# Patient Record
Sex: Female | Born: 2000 | Marital: Single | Smoking: Never smoker
Health system: Southern US, Community
[De-identification: ages and names within clinical notes are randomized; demographics above are authoritative.]

## PROBLEM LIST (undated history)

## (undated) DIAGNOSIS — E559 Vitamin D deficiency, unspecified: Secondary | ICD-10-CM

## (undated) HISTORY — DX: Vitamin D deficiency, unspecified: E55.9

---
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
## 2022-05-17 IMAGING — US US BREAST*R* LIMITED INC AXILLA
1 series · 1 of 1 positions shown · non-contrast
Comparison: Previous exam(s).

CLINICAL DATA: Palpable lump superior to the right breast.

EXAM:
ULTRASOUND OF THE RIGHT CHEST WALL

[Series 1: us breast*right* limited inc axilla · 0.07mm/px · 1 of 1 slices shown]
[im 1/1]
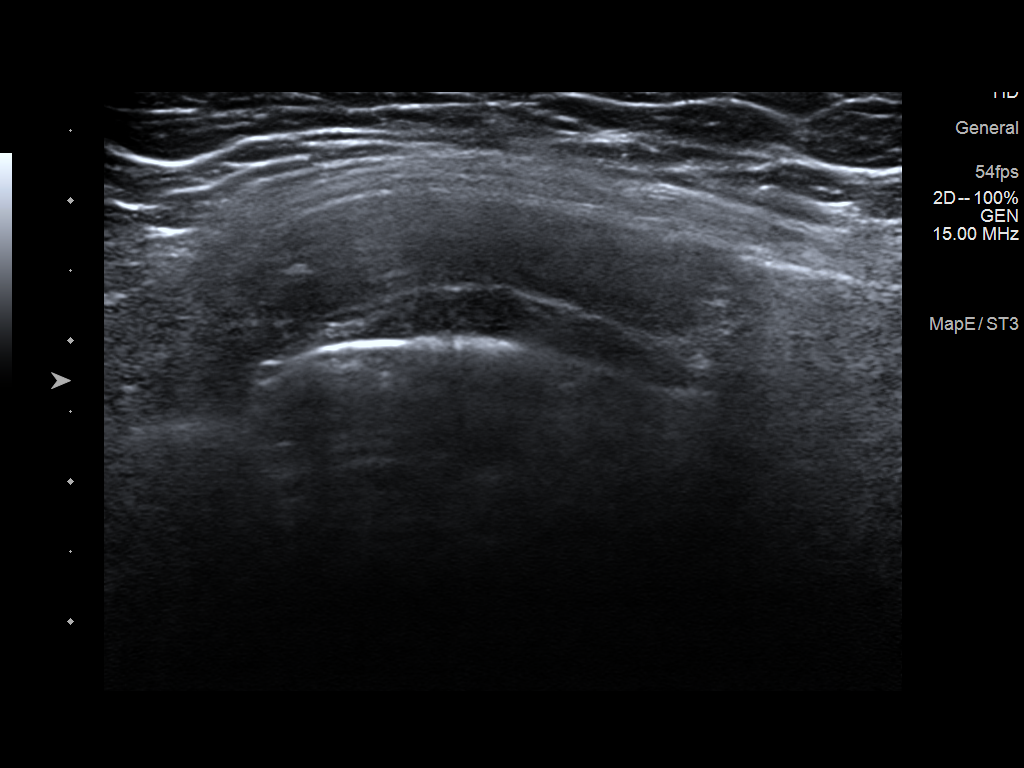

[1 of 1 positions shown; findings below may reference images not displayed]

FINDINGS: On physical exam, there is mild prominence of the right first
costochondral cartilage relative to the left.

Targeted ultrasound is performed, showing the right costochondral
cartilage in the region of the patient's lump. The anterior border
of the first costochondral cartilage is more convex versus the left.
IMPRESSION: The patient appears to be palpating mild asymmetry in the
costochondral cartilage on the right relative to the left. No
suspicious masses identified.

RECOMMENDATION:
Treatment of the patient's symptoms should be based on clinical and
physical exam given lack of abnormal findings. I believe the
patient's findings are likely due to mild asymmetry in the
costochondral cartilages as above. If there is continued concern,
MRI or CT could better evaluate.

I have discussed the findings and recommendations with the patient.
If applicable, a reminder letter will be sent to the patient
regarding the next appointment.

BI-RADS CATEGORY  2: Benign.

## 2022-06-20 ENCOUNTER — Ambulatory Visit (INDEPENDENT_AMBULATORY_CARE_PROVIDER_SITE_OTHER): Payer: Medicaid Other | Admitting: Pediatric Endocrinology

## 2022-06-20 DIAGNOSIS — E8881 Metabolic syndrome: Secondary | ICD-10-CM

## 2022-07-05 HISTORY — PX: WISDOM TOOTH EXTRACTION: SHX21

## 2022-08-30 ENCOUNTER — Ambulatory Visit (INDEPENDENT_AMBULATORY_CARE_PROVIDER_SITE_OTHER): Payer: Medicaid Other | Admitting: Pediatric Endocrinology

## 2022-08-30 ENCOUNTER — Encounter (INDEPENDENT_AMBULATORY_CARE_PROVIDER_SITE_OTHER): Payer: Self-pay | Admitting: Pediatric Endocrinology

## 2022-08-30 VITALS — BP 108/70 | Ht 62.28 in | Wt 185.6 lb

## 2022-08-30 DIAGNOSIS — L680 Hirsutism: Secondary | ICD-10-CM | POA: Diagnosis not present

## 2022-08-30 DIAGNOSIS — N915 Oligomenorrhea, unspecified: Secondary | ICD-10-CM

## 2022-08-30 DIAGNOSIS — E88819 Insulin resistance, unspecified: Secondary | ICD-10-CM

## 2022-08-30 DIAGNOSIS — L83 Acanthosis nigricans: Secondary | ICD-10-CM

## 2022-08-30 LAB — POCT GLYCOSYLATED HEMOGLOBIN (HGB A1C): Hemoglobin A1C: 5.2 % (ref 4.0–5.6)

## 2022-08-30 LAB — POCT GLUCOSE (DEVICE FOR HOME USE): POC Glucose: 91 mg/dl (ref 70–99)

## 2022-08-30 MED ORDER — METFORMIN HCL ER 750 MG PO TB24: 750.0000 mg | ORAL_TABLET | Freq: Every day | ORAL | 1 refills | Status: DC

## 2022-08-30 NOTE — Progress Notes (Signed)
Subjective:  Subjective  Patient Name: Samantha White Date of Birth: 2001/09/09  MRN: [id number]  Samantha White  presents to the office today for follow up evaluation and management of her oligomenorrhea.   HISTORY OF PRESENT ILLNESS:   Samantha White is a 21 y.o. [place] female referred for  Irregular menses with oligomenorrhea.   Samantha White was accompanied by her mother and sister.   1. Samantha White was seen by her PCP in May 2020 for eye irritation/swelling. At that visit they discussed previous issues with her hormone levels, hair growth, and irregular menses. Dr. Maisie Fus felt that it could all be connected and referred her to Endocrinology. She had previously been seen the adolescent medicine clinic. They recommended OCP but she did not start it.   2. Samantha White was last seen in pediatric endocrine clinic on 12/21/21. In the interim she has been generally healthy.   She feels that everything has been pretty stable.   She did not have a period from May-Aug. She did have a period on 9/8. She did not get a period in October.   She stopped going to [facility] right before Ramadan (April).   She is doing some work outs at home. 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 She says that the exercises she was doing at [facility] were more intense and also more towards muscle building- which is not her goal. She says that her goal is to lose weight. She would like to tone a little.   She is taking Metformin "on and off". She was more "off" during the summer due to irregular schedules and eating more often away from home.   She is meant to be taking Metformin 500 mg BID. However, when she tried that it "made me SO sick I couldn't even sleep". She describes it as severe stomach pain.   She is drinking mostly water.      3. [name]:  Constitutional: The patient feels "feeling good". The patient seems healthy and active. Eyes: Vision seems to be good. There are no recognized eye  problems. Neck: The patient has no complaints of anterior neck swelling, soreness, tenderness, pressure, discomfort, or difficulty swallowing.   Heart: Heart rate increases with exercise or other physical activity. The patient has no complaints of palpitations, irregular heart beats, chest pain, or chest pressure.   Gastrointestinal: Bowel movents seem normal. The patient has no complaints of excessive hunger, diarrhea, or constipation.  Legs: Muscle mass and strength seem normal. There are no complaints of numbness, tingling, burning, or pain. No edema is noted.  Feet: There are no obvious foot problems. There are no complaints of numbness, tingling, burning, or pain. No edema is noted. Neurologic: There are no recognized problems with muscle movement and strength, sensation, or coordination. GYN/GU: per HPI LMP 07/08/22  PAST MEDICAL, FAMILY, AND SOCIAL HISTORY   History reviewed. No pertinent past medical history.  Family History  Problem Relation Age of Onset   Diabetes Maternal Grandmother    Diabetes Maternal Grandfather    Hypothyroidism Paternal Grandmother      Current Outpatient Medications:    metFORMIN (GLUCOPHAGE-XR) 750 MG 24 hr tablet, Take 1 tablet (750 mg total) by mouth daily with breakfast., Disp: 90 tablet, Rfl: 1   Cholecalciferol (VITAMIN D) 50 MCG (2000 UT) CAPS, Take by mouth. (Patient not taking: Reported on 08/30/2022), Disp: , Rfl:    Ibuprofen (MOTRIN PO), Take by mouth. (Patient not taking: Reported on 06/30/2021), Disp: ,  Rfl:    naproxen (NAPROSYN) 500 MG tablet, TAKE 1 TABLET(500 MG) BY MOUTH TWICE DAILY WITH A MEAL (Patient not taking: Reported on 08/30/2022), Disp: 60 tablet, Rfl: 3  Allergies as of 08/30/2022   (No Known Allergies)     reports that she has never smoked. She has never used smokeless tobacco. Pediatric History  Patient Parents   Samantha White, Samantha White (Mother)   Other Topics Concern   Not on file  Social History Narrative   She lives with  her parents and siblings.       Attends [employer] for dental hygiene. 23-24 school year    1. School and Family: Lives with parents and siblings. [employer]. Dental Hygienist program 2. Activities: [employer]. Wants to do dental hygenics. Anatomy and Physiology 3. Primary Care Provider: Barnet Pall, MD  ROS: There are no other significant problems involving Samantha White's other body systems.    Objective:  Objective   Vital Signs:    BP 108/70   Ht 5' 2.28" (1.582 m)   Wt 185 lb 9.6 oz (84.2 kg)   LMP 07/08/2022 (Exact Date)   BMI 33.64 kg/m    Blood pressure %iles are not available for patients who are 18 years or older.  Ht Readings from Last 3 Encounters:  08/30/22 5' 2.28" (1.582 m) (22 %, Z= -0.79)*  12/21/21 5' 2.36" (1.584 m) (23 %, Z= -0.75)*  07/13/21 5\' 2"  (1.575 m) (19 %, Z= -0.88)*   * Growth percentiles are based on CDC (Girls, 2-20 Years) data.   Wt Readings from Last 3 Encounters:  08/30/22 185 lb 9.6 oz (84.2 kg) (96 %, Z= 1.71)*  12/21/21 183 lb (83 kg) (95 %, Z= 1.69)*  07/13/21 180 lb (81.6 kg) (95 %, Z= 1.66)*   * Growth percentiles are based on CDC (Girls, 2-20 Years) data.   HC Readings from Last 3 Encounters:  No data found for [facility]   Body surface area is 1.92 meters squared. 22 %ile (Z= -0.79) based on CDC (Girls, 2-20 Years) Stature-for-age data based on Stature recorded on 08/30/2022. 96 %ile (Z= 1.71) based on CDC (Girls, 2-20 Years) weight-for-age data using vitals from 08/30/2022.  PHYSICAL EXAM:    Constitutional: The patient appears healthy and well nourished. The patient's height and weight are normal for age. Weight is +2 since last visit Head: The head is normocephalic. Face: The face appears normal. There are no obvious dysmorphic features. Eyes: The eyes appear to be normally formed and spaced. Gaze is conjugate. There is no obvious arcus or proptosis. Moisture appears normal. Ears: The ears are normally placed and appear externally  normal. Mouth: The oropharynx and tongue appear normal. Dentition appears to be normal for age. Oral moisture is normal. Neck: The neck appears to be visibly normal.  The thyroid gland is 18-20 grams in size. The consistency of the thyroid gland is normal. The thyroid gland is not tender to palpation. Lungs: No increased work of breathing or cough. CTA Heart: Heart rate , pulses, and peripheral perfusion are normal. S1S2 RRR Abdomen: The abdomen appears to be enlarged in size for the patient's age. Bowel sounds are normal. There is no obvious hepatomegaly, splenomegaly, or other mass effect.  Arms: Muscle size and bulk are normal for age. Hands: There is no obvious tremor. Phalangeal and metacarpophalangeal joints are normal. Palmar muscles are normal for age. Palmar skin is normal. Palmar moisture is also normal. Mild acanthosis in axillae- improving Legs: Muscles appear normal for age. No  edema is present. Feet: Feet are normally formed. Dorsalis pedal pulses are normal. Neurologic: Strength is normal for age in both the upper and lower extremities. Muscle tone is normal. Sensation to touch is normal in both the legs and feet.    LAB DATA:    Lab Results  Component Value Date   HGBA1C 5.2 08/30/2022   HGBA1C 5.0 12/21/2021   HGBA1C 5.1 06/30/2021   HGBA1C 5.2 12/29/2020     Results for orders placed or performed in visit on 08/30/22  POCT Glucose (Device for Home Use)  Result Value Ref Range   Glucose Fasting, POC     POC Glucose 91 70 - 99 mg/dl  POCT glycosylated hemoglobin (Hb A1C)  Result Value Ref Range   Hemoglobin A1C 5.2 4.0 - 5.6 %   HbA1c POC (<> result, manual entry)     HbA1c, POC (prediabetic range)     HbA1c, POC (controlled diabetic range)       Office Visit on 12/21/2021  Component Date Value Ref Range Status   Hemoglobin A1C 12/21/2021 5.0  4.0 - 5.6 % Final   POC Glucose 12/21/2021 95  70 - 99 mg/dl Final     Results for orders placed or performed in  visit on 08/30/22 (from the past 672 hour(s))  POCT Glucose (Device for Home Use)   Collection Time: 08/30/22  1:46 PM  Result Value Ref Range   Glucose Fasting, POC     POC Glucose 91 70 - 99 mg/dl  POCT glycosylated hemoglobin (Hb A1C)   Collection Time: 08/30/22  1:54 PM  Result Value Ref Range   Hemoglobin A1C 5.2 4.0 - 5.6 %   HbA1c POC (<> result, manual entry)     HbA1c, POC (prediabetic range)     HbA1c, POC (controlled diabetic range)        Assessment and Plan:  Assessment  ASSESSMENT: Liliya is a 21 y.o. [place] woman referred for oligomenorrhea and hirsutism with concerns for PCOS    She was previously evaluated for PCOS in Adolescent clinic in October 2019 and did not follow through with their recommendation to start OCP.   Oligomenorrhea/Hirsutism - Continues on Metformin - Had more regular menses with more intensive exercise ([employer]) - Over Ramadan she stopped her intense exercise  - She did not tolerate the increase in Metformin dose to 1000 mg per day and was less consistent with taking it over the summer - She did not have menses in June/July/Aug. She did have a period in September.  - Discussed options including   Re intensifying exercise  Increasing Metformin  Starting GLP-1 - She would like to focus on exercise and increase the Metformin to 750.     PLAN:    1. Diagnostic: Lab Orders         POCT Glucose (Device for Home Use)         POCT glycosylated hemoglobin (Hb A1C)      2. Therapeutic: Increase metformin to 750 mg daily. Discussed option to add a GLP-1 but she declines for now.  3. Patient education: Discussion as above.  4. Follow-up: Return in about 4 months (around 12/29/2022).      Dessa Phi, MD   LOS Level of Service: >40 minutes spent today reviewing the medical chart, counseling the patient/family, and documenting today's encounter.      Patient referred by Barnet Pall, MD for  Oligomenorrhea, hirsutism    Copy of this note sent to [facility],  Vania Rea, MD

## 2022-09-07 ENCOUNTER — Other Ambulatory Visit (INDEPENDENT_AMBULATORY_CARE_PROVIDER_SITE_OTHER): Payer: Self-pay | Admitting: Pediatric Endocrinology

## 2022-09-07 DIAGNOSIS — N946 Dysmenorrhea, unspecified: Secondary | ICD-10-CM

## 2022-09-07 MED ORDER — NAPROXEN 500 MG PO TABS: ORAL_TABLET | ORAL | 3 refills | Status: AC

## 2022-09-07 NOTE — Telephone Encounter (Signed)
  Name of who is calling: [place]  Best contact number: [phone]  Provider they see: Dr. Vanessa [place]  Reason for call: [place] is calling to get her naproxen refilled.      PRESCRIPTION REFILL ONLY  Name of prescription: Naproxen  Pharmacy: [facility]

## 2023-01-03 ENCOUNTER — Ambulatory Visit (INDEPENDENT_AMBULATORY_CARE_PROVIDER_SITE_OTHER): Payer: Medicaid Other | Admitting: Pediatric Endocrinology

## 2023-03-08 ENCOUNTER — Ambulatory Visit (INDEPENDENT_AMBULATORY_CARE_PROVIDER_SITE_OTHER): Payer: Medicaid Other | Admitting: Pediatric Endocrinology

## 2023-03-23 ENCOUNTER — Ambulatory Visit (INDEPENDENT_AMBULATORY_CARE_PROVIDER_SITE_OTHER): Payer: Medicaid Other | Admitting: Nurse Practitioner

## 2023-03-23 ENCOUNTER — Encounter: Payer: Self-pay | Admitting: Nurse Practitioner

## 2023-03-23 VITALS — BP 102/76 | HR 77 | Temp 97.3°F | Ht 63.0 in | Wt 185.0 lb

## 2023-03-23 DIAGNOSIS — E559 Vitamin D deficiency, unspecified: Secondary | ICD-10-CM | POA: Diagnosis not present

## 2023-03-23 DIAGNOSIS — Z0000 Encounter for general adult medical examination without abnormal findings: Secondary | ICD-10-CM | POA: Insufficient documentation

## 2023-03-23 DIAGNOSIS — Z131 Encounter for screening for diabetes mellitus: Secondary | ICD-10-CM

## 2023-03-23 DIAGNOSIS — E669 Obesity, unspecified: Secondary | ICD-10-CM | POA: Diagnosis not present

## 2023-03-23 LAB — CBC WITH DIFFERENTIAL/PLATELET
Basophils Absolute: 0 10*3/uL (ref 0.0–0.1)
Basophils Relative: 0.4 % (ref 0.0–3.0)
Eosinophils Absolute: 0 10*3/uL (ref 0.0–0.7)
Eosinophils Relative: 0.6 % (ref 0.0–5.0)
HCT: 39.6 % (ref 36.0–46.0)
Hemoglobin: 12.6 g/dL (ref 12.0–15.0)
Lymphocytes Relative: 34.9 % (ref 12.0–46.0)
Lymphs Abs: 2.6 10*3/uL (ref 0.7–4.0)
MCHC: 31.9 g/dL (ref 30.0–36.0)
MCV: 67.1 fl — ABNORMAL LOW (ref 78.0–100.0)
Monocytes Absolute: 0.5 10*3/uL (ref 0.1–1.0)
Monocytes Relative: 6.7 % (ref 3.0–12.0)
Neutro Abs: 4.2 10*3/uL (ref 1.4–7.7)
Neutrophils Relative %: 57.4 % (ref 43.0–77.0)
Platelets: 290 10*3/uL (ref 150.0–400.0)
RBC: 5.91 Mil/uL — ABNORMAL HIGH (ref 3.87–5.11)
RDW: 15 % — ABNORMAL HIGH (ref 11.5–14.6)
WBC: 7.4 10*3/uL (ref 4.5–10.5)

## 2023-03-23 LAB — COMPREHENSIVE METABOLIC PANEL
ALT: 13 U/L (ref 0–35)
AST: 16 U/L (ref 0–37)
Albumin: 4.4 g/dL (ref 3.5–5.2)
Alkaline Phosphatase: 51 U/L (ref 39–117)
BUN: 10 mg/dL (ref 6–23)
CO2: 30 mEq/L (ref 19–32)
Calcium: 9.8 mg/dL (ref 8.4–10.5)
Chloride: 100 mEq/L (ref 96–112)
Creatinine, Ser: 0.67 mg/dL (ref 0.40–1.20)
GFR: 126.08 mL/min (ref >60.00)
Glucose, Bld: 79 mg/dL (ref 70–99)
Potassium: 3.9 mEq/L (ref 3.5–5.1)
Sodium: 138 mEq/L (ref 135–145)
Total Bilirubin: 0.4 mg/dL (ref 0.2–1.2)
Total Protein: 7.2 g/dL (ref 6.0–8.3)

## 2023-03-23 LAB — VITAMIN D 25 HYDROXY (VIT D DEFICIENCY, FRACTURES): VITD: 21.5 ng/mL — ABNORMAL LOW (ref 30.00–100.00)

## 2023-03-23 LAB — HEMOGLOBIN A1C: Hgb A1c MFr Bld: 5.4 % (ref 4.6–6.5)

## 2023-03-23 NOTE — Assessment & Plan Note (Signed)
She has a history of vitamin D deficiency and is not currently taking a supplement. Will check vitamin D levels today and adjust regimen based on results.

## 2023-03-23 NOTE — Assessment & Plan Note (Signed)
BMI 32.7. Discussed nutrition, exercise.

## 2023-03-23 NOTE — Assessment & Plan Note (Signed)
Health maintenance reviewed and updated. Discussed nutrition, exercise. Check CMP, CBC today. Follow-up 1 year.   

## 2023-03-23 NOTE — Patient Instructions (Signed)
It was great to see you!  We are checking your labs today and will let you know the results via mychart/phone.   Let me know if you need anything else for school!   Let's follow-up in 1 year, sooner if you have concerns.  If a referral was placed today, you will be contacted for an appointment. Please note that routine referrals can sometimes take up to 3-4 weeks to process. Please call our office if you haven't heard anything after this time frame.  Take care,  Rodman Pickle, NP

## 2023-03-23 NOTE — Progress Notes (Signed)
New Patient Visit  BP 102/76 (BP Location: Left Arm)   Pulse 77   Temp (!) 97.3 F (36.3 C)   Ht 5\' 3"  (1.6 m)   Wt 185 lb (83.9 kg)   LMP 02/27/2023 (Exact Date)   SpO2 98%   BMI 32.77 kg/m    Subjective:    Patient ID: Samantha White, female    DOB: February 20, 2001, 22 y.o.   MRN: [id number]  CC: Chief Complaint  Patient presents with   Establish Care    NP. Est. Care, no concerns    HPI: Samantha White is a 22 y.o. female presents for new patient visit to establish care.  Introduced to Publishing rights manager role and practice setting.  All questions answered.  Discussed provider/patient relationship and expectations.  She has no concerns today. 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   Past Medical History:  Diagnosis Date   Vitamin D deficiency     Past Surgical History:  Procedure Laterality Date   WISDOM TOOTH EXTRACTION  07/05/2022   all four removed    Family History  Problem Relation Age of Onset   Migraines Mother    Polycystic ovary syndrome Mother    Healthy Father    Diabetes Maternal Grandmother    Diabetes Maternal Grandfather    Hypothyroidism Paternal Grandmother      Social History   Tobacco Use   Smoking status: Never   Smokeless tobacco: Never   Tobacco comments:    Dad smokes outside  Vaping Use   Vaping Use: Never used  Substance Use Topics   Alcohol use: Never   Drug use: Never    Current Outpatient Medications on File Prior to Visit  Medication Sig Dispense Refill   naproxen (NAPROSYN) 500 MG tablet TAKE 1 TABLET(500 MG) BY MOUTH TWICE DAILY WITH A MEAL (Patient not taking: Reported on 03/23/2023) 60 tablet 3   No current facility-administered medications on file prior to visit.     Review of Systems  Constitutional:  Positive for fatigue. Negative for fever.  HENT: Negative.    Eyes: Negative.   Respiratory: Negative.    Cardiovascular: Negative.   Gastrointestinal: Negative.    Endocrine: Negative.   Genitourinary: Negative.   Musculoskeletal: Negative.   Skin: Negative.   Neurological:  Positive for headaches (3-4 times a month).  Psychiatric/Behavioral: Negative.        Objective:    BP 102/76 (BP Location: Left Arm)   Pulse 77   Temp (!) 97.3 F (36.3 C)   Ht 5\' 3"  (1.6 m)   Wt 185 lb (83.9 kg)   LMP 02/27/2023 (Exact Date)   SpO2 98%   BMI 32.77 kg/m   Wt Readings from Last 3 Encounters:  03/23/23 185 lb (83.9 kg)  08/30/22 185 lb 9.6 oz (84.2 kg) (96 %, Z= 1.71)*  12/21/21 183 lb (83 kg) (95 %, Z= 1.69)*   * Growth percentiles are based on CDC (Girls, 2-20 Years) data.    BP Readings from Last 3 Encounters:  03/23/23 102/76  08/30/22 108/70  12/21/21 118/72    Physical Exam Vitals and nursing note reviewed.  Constitutional:      General: She is not in acute distress.    Appearance: Normal appearance.  HENT:     Head: Normocephalic and atraumatic.     Right Ear: Tympanic membrane, ear canal and external ear normal.     Left Ear: Tympanic membrane, ear canal and  external ear normal.  Eyes:     Conjunctiva/sclera: Conjunctivae normal.  Cardiovascular:     Rate and Rhythm: Normal rate and regular rhythm.     Pulses: Normal pulses.     Heart sounds: Normal heart sounds.  Pulmonary:     Effort: Pulmonary effort is normal.     Breath sounds: Normal breath sounds.  Abdominal:     Palpations: Abdomen is soft.     Tenderness: There is no abdominal tenderness.  Musculoskeletal:        General: Normal range of motion.     Cervical back: Normal range of motion and neck supple.     Right lower leg: No edema.     Left lower leg: No edema.  Lymphadenopathy:     Cervical: No cervical adenopathy.  Skin:    General: Skin is warm and dry.  Neurological:     General: No focal deficit present.     Mental Status: She is alert and oriented to person, place, and time.     Cranial Nerves: No cranial nerve deficit.     Coordination:  Coordination normal.     Gait: Gait normal.  Psychiatric:        Mood and Affect: Mood normal.        Behavior: Behavior normal.        Thought Content: Thought content normal.        Judgment: Judgment normal.       Assessment & Plan:   Problem List Items Addressed This Visit       Other   Vitamin D deficiency    She has a history of vitamin D deficiency and is not currently taking a supplement. Will check vitamin D levels today and adjust regimen based on results.       Relevant Orders   VITAMIN D 25 Hydroxy (Vit-D Deficiency, Fractures) (Completed)   Routine general medical examination at a health care facility - Primary    Health maintenance reviewed and updated. Discussed nutrition, exercise. Check CMP, CBC today. Follow-up 1 year.        Relevant Orders   CBC with Differential/Platelet (Completed)   Comprehensive metabolic panel (Completed)   Obesity (BMI 30-39.9)    BMI 32.7. Discussed nutrition, exercise.       Relevant Orders   Hemoglobin A1c (Completed)   Other Visit Diagnoses     Screening for diabetes mellitus       Check A1c today.   Relevant Orders   Hemoglobin A1c (Completed)       LABORATORY TESTING:  - Pap smear: not applicable  IMMUNIZATIONS:   - Tdap: Tetanus vaccination status reviewed: last tetanus booster within 10 years. - Influenza: Postponed to flu season - Pneumovax: Not applicable - Prevnar: Not applicable - HPV: Refused - Zostavax vaccine: Not applicable  SCREENING: -Mammogram: Not applicable  - Colonoscopy: Not applicable  - Bone Density: Not applicable  -Hearing Test: Not applicable  -Spirometry: Not applicable   PATIENT COUNSELING:   Advised to take 1 mg of folate supplement per day if capable of pregnancy.   Sexuality: Discussed sexually transmitted diseases, partner selection, use of condoms, avoidance of unintended pregnancy  and contraceptive alternatives.   Advised to avoid cigarette smoking.  I discussed with  the patient that most people either abstain from alcohol or drink within safe limits (<=14/week and <=4 drinks/occasion for males, <=7/weeks and <= 3 drinks/occasion for females) and that the risk for alcohol disorders and other health effects rises  proportionally with the number of drinks per week and how often a drinker exceeds daily limits.  Discussed cessation/primary prevention of drug use and availability of treatment for abuse.   Diet: Encouraged to adjust caloric intake to maintain  or achieve ideal body weight, to reduce intake of dietary saturated fat and total fat, to limit sodium intake by avoiding high sodium foods and not adding table salt, and to maintain adequate dietary potassium and calcium preferably from fresh fruits, vegetables, and low-fat dairy products.    stressed the importance of regular exercise  Injury prevention: Discussed safety belts, safety helmets, smoke detector, smoking near bedding or upholstery.   Dental health: Discussed importance of regular tooth brushing, flossing, and dental visits.    NEXT PREVENTATIVE PHYSICAL DUE IN 1 YEAR.   Follow up plan: Return in about 1 year (around 03/22/2024) for CPE.

## 2023-04-11 ENCOUNTER — Ambulatory Visit (INDEPENDENT_AMBULATORY_CARE_PROVIDER_SITE_OTHER): Payer: Medicaid Other | Admitting: Pediatric Endocrinology

## 2023-05-05 ENCOUNTER — Telehealth: Payer: Self-pay | Admitting: Nurse Practitioner

## 2023-05-05 ENCOUNTER — Encounter (INDEPENDENT_AMBULATORY_CARE_PROVIDER_SITE_OTHER): Payer: Self-pay

## 2023-05-05 NOTE — Telephone Encounter (Signed)
Pt is wanting her NCIR uploaded into her mychart. Pt at [phone] if any further questions.

## 2023-05-08 NOTE — Telephone Encounter (Signed)
I called and spoke with patient and I told her that I could mail to her. She wanted it mailed to [address] address

## 2023-05-09 ENCOUNTER — Encounter (INDEPENDENT_AMBULATORY_CARE_PROVIDER_SITE_OTHER): Payer: Self-pay | Admitting: Pediatric Endocrinology

## 2023-05-09 ENCOUNTER — Ambulatory Visit (INDEPENDENT_AMBULATORY_CARE_PROVIDER_SITE_OTHER): Payer: Medicaid Other | Admitting: Pediatric Endocrinology

## 2023-05-09 VITALS — BP 116/70 | HR 96 | Wt 186.6 lb

## 2023-05-09 DIAGNOSIS — L680 Hirsutism: Secondary | ICD-10-CM | POA: Diagnosis not present

## 2023-05-09 DIAGNOSIS — N915 Oligomenorrhea, unspecified: Secondary | ICD-10-CM | POA: Diagnosis not present

## 2023-05-09 DIAGNOSIS — N914 Secondary oligomenorrhea: Secondary | ICD-10-CM

## 2023-05-09 NOTE — Patient Instructions (Signed)
Continue to work on diet and exercise. I think that the inositol is fine to take. Please follow up with your PCP as needed.

## 2023-05-09 NOTE — Progress Notes (Signed)
Subjective:  Subjective  Patient Name: Samantha White Date of Birth: [birth year]  MRN: [id number]  Samantha White  presents to the office today for follow up evaluation and management of her oligomenorrhea.   HISTORY OF PRESENT ILLNESS:   Samantha White is a 22 y.o. [place] female referred for  Irregular menses with oligomenorrhea.   [place]oc[place]ontas was accompanied by her mother and sister.   1. Samantha White was seen by her PCP in May 2020 for eye irritation/swelling. At that visit they discussed previous issues with her hormone levels, hair growth, and irregular menses. Dr. Eddie Candle felt that it could all be connected and referred her to Endocrinology. She had previously been seen the adolescent medicine clinic. They recommended OCP but she did not start it.   2. Samantha White was last seen in pediatric endocrine clinic on 08/31/23. In the interim she has been generally healthy.   She has been exercising more. Diet has been stable to a "bit better".   Periods have continued to be erratic. LMP was end of April into early May. No cycle in June or July.   She did have cycles in February and March. None in January but yes in December.   She was taking inositol supplements. This helped to regulate her periods until Ramadan- and then she got off schedule.   She stopped taking Metformin due to terrible bloating and constipation.   She is no longer weighing herself. She is using a tape measure and has seen that the shape of her body is improving with loss of inches!   3. [name]:  Constitutional: The patient feels "feeling good". The patient seems healthy and active. Eyes: Vision seems to be good. There are no recognized eye problems. Neck: The patient has no complaints of anterior neck swelling, soreness, tenderness, pressure, discomfort, or difficulty swallowing.   Heart: Heart rate increases with exercise or other physical activity. The patient has no complaints of palpitations, irregular heart beats,  chest pain, or chest pressure.   Gastrointestinal: Bowel movents seem normal. The patient has no complaints of excessive hunger, diarrhea, or constipation.  Legs: Muscle mass and strength seem normal. There are no complaints of numbness, tingling, burning, or pain. No edema is noted.  Feet: There are no obvious foot problems. There are no complaints of numbness, tingling, burning, or pain. No edema is noted. Neurologic: There are no recognized problems with muscle movement and strength, sensation, or coordination. GYN/GU: per HPI LMP 02/27/23  PAST MEDICAL, FAMILY, AND SOCIAL HISTORY   Past Medical History:  Diagnosis Date   Vitamin D deficiency     Family History  Problem Relation Age of Onset   Migraines Mother    Polycystic ovary syndrome Mother    Healthy Father    Diabetes Maternal Grandmother    Diabetes Maternal Grandfather    Hypothyroidism Paternal Grandmother      Current Outpatient Medications:    Bioflavonoid Products (VITAMIN C PLUS PO), Take by mouth. Includes Vitamin D and Zinc., Disp: , Rfl:    naproxen (NAPROSYN) 500 MG tablet, TAKE 1 TABLET(500 MG) BY MOUTH TWICE DAILY WITH A MEAL (Patient not taking: Reported on 03/23/2023), Disp: 60 tablet, Rfl: 3  Allergies as of 05/09/2023   (No Known Allergies)     reports that she has never smoked. She has been exposed to tobacco smoke. She has never used smokeless tobacco. She reports that she does not drink alcohol and does not use drugs. Pediatric History  Patient Parents  Dianah Field (Mother)   Other Topics Concern   Not on file  Social History Narrative   She lives with her parents and siblings.       Attends [employer] for dental hygiene. 23-24 school year    1. School and Family: Lives with parents and siblings. [employer]. Dental Hygienist program 2. Activities:  Wants to do dental hygenics. Anatomy and Physiology 3. Primary Care Provider: Young, Lauren E, PA  ROS: There are no other significant problems  involving Samantha White's other body systems.    Objective:  Objective   Vital Signs:    BP 116/70 (BP Location: Right Arm, Patient Position: Sitting, Cuff Size: Large)   Pulse 96   Wt 186 lb 9.6 oz (84.6 kg)   LMP 03/27/2023 (Approximate)   BMI 33.05 kg/m    Growth %ile SmartLinks can only be used for patients less than 75 years old.  Ht Readings from Last 3 Encounters:  03/23/23 5\' 3"  (1.6 m)  08/30/22 5' 2.28" (1.582 m) (22 %, Z= -0.79)*  12/21/21 5' 2.36" (1.584 m) (23 %, Z= -0.75)*   * Growth percentiles are based on CDC (Girls, 2-20 Years) data.   Wt Readings from Last 3 Encounters:  05/09/23 186 lb 9.6 oz (84.6 kg)  03/23/23 185 lb (83.9 kg)  08/30/22 185 lb 9.6 oz (84.2 kg) (96 %, Z= 1.71)*   * Growth percentiles are based on CDC (Girls, 2-20 Years) data.   HC Readings from Last 3 Encounters:  No data found for [facility]   Body surface area is 1.94 meters squared. Facility age limit for growth %iles is 20 years. Facility age limit for growth %iles is 20 years.  PHYSICAL EXAM:    Constitutional: The patient appears healthy and well nourished. The patient's height and weight are normal for age. Weight is +2 since last visit Head: The head is normocephalic. Face: The face appears normal. There are no obvious dysmorphic features. Eyes: The eyes appear to be normally formed and spaced. Gaze is conjugate. There is no obvious arcus or proptosis. Moisture appears normal. Ears: The ears are normally placed and appear externally normal. Mouth: The oropharynx and tongue appear normal. Dentition appears to be normal for age. Oral moisture is normal. Neck: The neck appears to be visibly normal.  The thyroid gland is 18-20 grams in size. The consistency of the thyroid gland is normal. The thyroid gland is not tender to palpation. Lungs: No increased work of breathing or cough. CTA Heart: Heart rate , pulses, and peripheral perfusion are normal. S1S2 RRR Abdomen: The abdomen appears to be  enlarged in size for the patient's age. Bowel sounds are normal. There is no obvious hepatomegaly, splenomegaly, or other mass effect.  Arms: Muscle size and bulk are normal for age. Hands: There is no obvious tremor. Phalangeal and metacarpophalangeal joints are normal. Palmar muscles are normal for age. Palmar skin is normal. Palmar moisture is also normal. Mild acanthosis in axillae- improving Legs: Muscles appear normal for age. No edema is present. Feet: Feet are normally formed. Dorsalis pedal pulses are normal. Neurologic: Strength is normal for age in both the upper and lower extremities. Muscle tone is normal. Sensation to touch is normal in both the legs and feet.    LAB DATA:    Lab Results  Component Value Date   HGBA1C 5.4 03/23/2023   HGBA1C 5.2 08/30/2022   HGBA1C 5.0 12/21/2021   HGBA1C 5.1 06/30/2021     Results for orders placed or performed  in visit on 03/23/23  CBC with Differential/Platelet  Result Value Ref Range   WBC 7.4 4.5 - 10.5 K/uL   RBC 5.91 (H) 3.87 - 5.11 Mil/uL   Hemoglobin 12.6 12.0 - 15.0 g/dL   HCT 16.1 09.6 - 04.5 %   MCV 67.1 Repeated and verified X2. (L) 78.0 - 100.0 fl   MCHC 31.9 30.0 - 36.0 g/dL   RDW 40.9 (H) 81.1 - 91.4 %   Platelets 290.0 150.0 - 400.0 K/uL   Neutrophils Relative % 57.4 43.0 - 77.0 %   Lymphocytes Relative 34.9 12.0 - 46.0 %   Monocytes Relative 6.7 3.0 - 12.0 %   Eosinophils Relative 0.6 0.0 - 5.0 %   Basophils Relative 0.4 0.0 - 3.0 %   Neutro Abs 4.2 1.4 - 7.7 K/uL   Lymphs Abs 2.6 0.7 - 4.0 K/uL   Monocytes Absolute 0.5 0.1 - 1.0 K/uL   Eosinophils Absolute 0.0 0.0 - 0.7 K/uL   Basophils Absolute 0.0 0.0 - 0.1 K/uL  Comprehensive metabolic panel  Result Value Ref Range   Sodium 138 135 - 145 mEq/L   Potassium 3.9 3.5 - 5.1 mEq/L   Chloride 100 96 - 112 mEq/L   CO2 30 19 - 32 mEq/L   Glucose, Bld 79 70 - 99 mg/dL   BUN 10 6 - 23 mg/dL   Creatinine, Ser 7.82 0.40 - 1.20 mg/dL   Total Bilirubin 0.4 0.2 -  1.2 mg/dL   Alkaline Phosphatase 51 39 - 117 U/L   AST 16 0 - 37 U/L   ALT 13 0 - 35 U/L   Total Protein 7.2 6.0 - 8.3 g/dL   Albumin 4.4 3.5 - 5.2 g/dL   GFR 956.21 >30.86 mL/min   Calcium 9.8 8.4 - 10.5 mg/dL  VITAMIN D 25 Hydroxy (Vit-D Deficiency, Fractures)  Result Value Ref Range   VITD 21.50 (L) 30.00 - 100.00 ng/mL  Hemoglobin A1c  Result Value Ref Range   Hgb A1c MFr Bld 5.4 4.6 - 6.5 %     Office Visit on 03/23/2023  Component Date Value Ref Range Status   WBC 03/23/2023 7.4  4.5 - 10.5 K/uL Final   RBC 03/23/2023 5.91 (H)  3.87 - 5.11 Mil/uL Final   Hemoglobin 03/23/2023 12.6  12.0 - 15.0 g/dL Final   HCT 57/84/6962 39.6  36.0 - 46.0 % Final   MCV 03/23/2023 67.1 Repeated and verified X2. (L)  78.0 - 100.0 fl Final   MCHC 03/23/2023 31.9  30.0 - 36.0 g/dL Final   RDW 95/28/4132 15.0 (H)  11.5 - 14.6 % Final   Platelets 03/23/2023 290.0  150.0 - 400.0 K/uL Final   Neutrophils Relative % 03/23/2023 57.4  43.0 - 77.0 % Final   Lymphocytes Relative 03/23/2023 34.9  12.0 - 46.0 % Final   Monocytes Relative 03/23/2023 6.7  3.0 - 12.0 % Final   Eosinophils Relative 03/23/2023 0.6  0.0 - 5.0 % Final   Basophils Relative 03/23/2023 0.4  0.0 - 3.0 % Final   Neutro Abs 03/23/2023 4.2  1.4 - 7.7 K/uL Final   Lymphs Abs 03/23/2023 2.6  0.7 - 4.0 K/uL Final   Monocytes Absolute 03/23/2023 0.5  0.1 - 1.0 K/uL Final   Eosinophils Absolute 03/23/2023 0.0  0.0 - 0.7 K/uL Final   Basophils Absolute 03/23/2023 0.0  0.0 - 0.1 K/uL Final   Sodium 03/23/2023 138  135 - 145 mEq/L Final   Potassium 03/23/2023 3.9  3.5 - 5.1  mEq/L Final   Chloride 03/23/2023 100  96 - 112 mEq/L Final   CO2 03/23/2023 30  19 - 32 mEq/L Final   Glucose, Bld 03/23/2023 79  70 - 99 mg/dL Final   BUN 16/08/9603 10  6 - 23 mg/dL Final   Creatinine, Ser 03/23/2023 0.67  0.40 - 1.20 mg/dL Final   Total Bilirubin 03/23/2023 0.4  0.2 - 1.2 mg/dL Final   Alkaline Phosphatase 03/23/2023 51  39 - 117 U/L Final   AST  03/23/2023 16  0 - 37 U/L Final   ALT 03/23/2023 13  0 - 35 U/L Final   Total Protein 03/23/2023 7.2  6.0 - 8.3 g/dL Final   Albumin 54/07/8118 4.4  3.5 - 5.2 g/dL Final   GFR 14/78/2956 126.08  >60.00 mL/min Final   Calculated using the CKD-EPI Creatinine Equation (2021)   Calcium 03/23/2023 9.8  8.4 - 10.5 mg/dL Final   VITD 21/30/8657 21.50 (L)  30.00 - 100.00 ng/mL Final   Hgb A1c MFr Bld 03/23/2023 5.4  4.6 - 6.5 % Final   Glycemic Control Guidelines for People with Diabetes:Non Diabetic:  <6%Goal of Therapy: <7%Additional Action Suggested:  >8%      No results found for this or any previous visit (from the past 672 hour(s)).     Assessment and Plan:  Assessment  ASSESSMENT: Saprina is a 22 y.o. [place] woman referred for oligomenorrhea and hirsutism with concerns for PCOS    She was previously evaluated for PCOS in Adolescent clinic in October 2019 and did not follow through with their recommendation to start OCP.   Oligomenorrhea/Hirsutism  - Had more regular menses with lifestyle changes plus inositol  - Periods were more regular before Ramadan  - Discussed options including   Starting OCP  Starting other form of hormonal regulation (Nexplanon) - She has opted to continue lifestyle intervention    PLAN:    1. Diagnostic:  Lab Orders  No laboratory test(s) ordered today     2. Therapeutic: lifestyle 3. Patient education: Discussion as above.  4. Follow-up: Return for follow up with PCP .      Dessa Phi, MD   LOS  >40 minutes spent today reviewing the medical chart, counseling the patient/family, and documenting today's encounter.       Patient referred by Barnet Pall, MD for  Oligomenorrhea, hirsutism   Copy of this note sent to Heron Nay, PA
